# Patient Record
Sex: Male | Born: 1970 | Race: White | Hispanic: No | Smoking: Current every day smoker
Health system: Southern US, Community
[De-identification: ages and names within clinical notes are randomized; demographics above are authoritative.]

## PROBLEM LIST (undated history)

## (undated) DIAGNOSIS — T7840XA Allergy, unspecified, initial encounter: Secondary | ICD-10-CM

## (undated) DIAGNOSIS — K219 Gastro-esophageal reflux disease without esophagitis: Secondary | ICD-10-CM

## (undated) DIAGNOSIS — F329 Major depressive disorder, single episode, unspecified: Secondary | ICD-10-CM

## (undated) DIAGNOSIS — F419 Anxiety disorder, unspecified: Secondary | ICD-10-CM

## (undated) DIAGNOSIS — F32A Depression, unspecified: Secondary | ICD-10-CM

## (undated) DIAGNOSIS — J45909 Unspecified asthma, uncomplicated: Secondary | ICD-10-CM

## (undated) DIAGNOSIS — E785 Hyperlipidemia, unspecified: Secondary | ICD-10-CM

## (undated) HISTORY — PX: FEMUR SURGERY: SHX943

## (undated) HISTORY — DX: Hyperlipidemia, unspecified: E78.5

## (undated) HISTORY — DX: Anxiety disorder, unspecified: F41.9

## (undated) HISTORY — PX: BACK SURGERY: SHX140

## (undated) HISTORY — DX: Allergy, unspecified, initial encounter: T78.40XA

## (undated) HISTORY — PX: WISDOM TOOTH EXTRACTION: SHX21

## (undated) HISTORY — DX: Gastro-esophageal reflux disease without esophagitis: K21.9

---

## 1997-08-17 ENCOUNTER — Inpatient Hospital Stay (HOSPITAL_COMMUNITY): Admission: EM | Admit: 1997-08-17 | Discharge: 1997-08-20 | Payer: Self-pay | Admitting: Emergency Medicine

## 2004-10-30 ENCOUNTER — Ambulatory Visit (HOSPITAL_BASED_OUTPATIENT_CLINIC_OR_DEPARTMENT_OTHER): Admission: RE | Admit: 2004-10-30 | Discharge: 2004-10-30 | Payer: Self-pay | Admitting: Urology

## 2004-10-30 ENCOUNTER — Encounter (INDEPENDENT_AMBULATORY_CARE_PROVIDER_SITE_OTHER): Payer: Self-pay | Admitting: Specialist

## 2004-10-30 ENCOUNTER — Ambulatory Visit (HOSPITAL_COMMUNITY): Admission: RE | Admit: 2004-10-30 | Discharge: 2004-10-30 | Payer: Self-pay | Admitting: Urology

## 2004-12-04 ENCOUNTER — Ambulatory Visit (HOSPITAL_COMMUNITY): Admission: RE | Admit: 2004-12-04 | Discharge: 2004-12-05 | Payer: Self-pay | Admitting: Neurosurgery

## 2008-11-04 ENCOUNTER — Encounter: Admission: RE | Admit: 2008-11-04 | Discharge: 2008-11-04 | Payer: Self-pay | Admitting: Specialist

## 2010-07-10 NOTE — Op Note (Signed)
NAMEMELVIN, WHITEFORD NO.:  [id number]   MEDICAL RECORD NO.:  [id number]          PATIENT TYPE:  OIB   LOCATION:  3007                         FACILITY:  [facility]   PHYSICIAN:  Danae Orleans. Venetia Maxon, M.D.  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   PROCEDURE:  Right L4-5 microdiskectomy with microdissection.   SURGEON:  Danae Orleans. Venetia Maxon, M.D.   ANESTHESIA:  General endotracheal anesthesia.   ESTIMATED BLOOD LOSS:  75 mL.   COMPLICATIONS:  None.   DISPOSITION:  To recovery.   INDICATIONS:  Jonathon Thompson is a 40 year old man with a right L5  radiculopathy.  He has a central disk protrusion at the L5-S1 level but a  free fragment of herniated disk material which appears to be compressing the  right L5 nerve root from an L4-5 disk herniation.  It was elected to take  him to surgery for a microdiskectomy at this affected level.   PROCEDURE:  Mr. Jonathon Thompson was brought to the operating room.  Following the  satisfactory and uncomplicated induction of general endotracheal anesthesia  and placement of intravenous lines, the patient was placed in the prone  position on the Wilson frame.  His low back was then prepped and draped in  the usual sterile fashion and the area of planned incision was infiltrated  with 0.25% Marcaine and 0.5% lidocaine and 1:100,000 epinephrine.  An  incision was made overlying what was felt to be the L4-5 level and was  carried to the lumbar dorsal fascia, which was then incised on the right  side of midline.  Subperiosteal dissection was performed, exposing the L4-5  interspace.  An intraoperative x-ray  was obtained with a marker,  demonstrating this to be the L4-5 level.  Subsequently a hemisemilaminotomy  of L4 was performed with high-speed drill and completed with Kerrison  rongeur, and ligamentous tissue was detached from the superior aspect of the  L5 lamina.  This was then removed in piecemeal fashion with resultant  decompression of the thecal sac, and a foraminotomy was done overlying the  L5 nerve root. The lateral recess was also decompressed.  The microscope was  brought into the field and using microdissection technique, the L5 nerve  root and thecal sac were mobilized medially, exposing multiple free  fragments of herniated disk material, which were directly beneath the  takeoff of the L5 nerve root.  A micropituitary was then used to remove  these free fragments of herniated disk material.  Subsequently after  hemostasis was obtained, the floor of the canal was palpated with a ball  probe but there did not appear to be any residual compression of the nerve  root or thecal sac.  Hemostasis was assured and the wound was irrigated and  2 mL of fentanyl in 80 mg  of Depo-Medrol was placed in the operative field.  The self-retaining retractor was removed.  The lumbar dorsal fascia was  closed with 0 Vicryl suture, subcutaneous tissue was reapproximated with 2-0  Vicryl interrupted, inverted sutures, and the skin edge was reapproximated  with interrupted 3-0 Vicryl subcuticular stitch.  The wound was dressed with  Dermabond.  The patient was extubated in the operating room and taken to the  recovery room in stable and satisfactory condition, having tolerated his  operation well, with counts correct at the end of the case.      Danae Orleans. Venetia Maxon, M.D.  Electronically Signed     JDS/MEDQ  D:  12/04/2004  T:  12/05/2004  Job:  [id number]

## 2010-07-10 NOTE — Op Note (Signed)
Jonathon Thompson, Jonathon Thompson         ACCOUNT NO.:  [id number]   MEDICAL RECORD NO.:  [id number]         PATIENT TYPE:  AMB   LOCATION:  [facility]                         FACILITY:  [facility]   PHYSICIAN:  Lindaann Slough, M.D.  DATE OF BIRTH:  01/18/1971   DATE OF PROCEDURE:  10/30/2004  DATE OF DISCHARGE:                                 OPERATIVE REPORT   PREOPERATIVE DIAGNOSIS:  Elective sterilization.   POSTOPERATIVE DIAGNOSIS:  Elective sterilization.   PROCEDURE DONE:  Bilateral vasectomy.   ANESTHESIA:  General.   INDICATION:  The patient is a 40 year old male who has two children and  wanted to have a vasectomy.  This has been discussed with his wife and they  both agree that their family is complete.  The procedure, the risks, and the  benefits have been discussed with them.  The risks include but are not  limited to hemorrhage, infection, swelling of the scrotum, early or late  recanalization.  They understand and are agreeable.  The patient wanted to  have the procedure under anesthesia.   Under general anesthesia, the patient was prepped and draped and placed in a  supine position.  A 1 cm incision was made on the left scrotum.  The  incision was carried down to the subcutaneous tissues.  The vas was then  secured with a vas clamp.  Then, the vas was separated from the vas sheath  and a segment of the vas was excised.  Each end of the transected vas was  then fulgurated and each end was reversed on itself and doubly ligated with  0 Vicryl.  The distal end of the vas was then covered with the vas sheath.  The proximal end of the vas was then left in the subcutaneous tissues,  completely separated from the distal end.  Hemostasis was secured with  electrocautery.  Then, the incision was closed with 4-0 Vicryl.  The same  procedure was then done on the right side.   The patient tolerated the procedure well and left the OR in satisfactory  condition to the postanesthesia care  unit.      Lindaann Slough, M.D.  Electronically Signed     MN/MEDQ  D:  10/30/2004  T:  10/30/2004  Job:  [id number]   cc:   Dr. Garwin Brothers

## 2011-04-29 ENCOUNTER — Other Ambulatory Visit: Payer: Self-pay | Admitting: Student

## 2011-04-29 ENCOUNTER — Ambulatory Visit
Admission: RE | Admit: 2011-04-29 | Discharge: 2011-04-29 | Disposition: A | Discharge status: Home / Self Care | Payer: No Typology Code available for payment source | Source: Ambulatory Visit | Attending: Student | Admitting: Student

## 2011-04-29 DIAGNOSIS — R7611 Nonspecific reaction to tuberculin skin test without active tuberculosis: Secondary | ICD-10-CM

## 2011-11-07 ENCOUNTER — Encounter (HOSPITAL_COMMUNITY): Payer: Self-pay | Admitting: *Deleted

## 2011-11-07 ENCOUNTER — Emergency Department (INDEPENDENT_AMBULATORY_CARE_PROVIDER_SITE_OTHER)
Admission: EM | Admit: 2011-11-07 | Discharge: 2011-11-07 | Disposition: A | Discharge status: Home / Self Care | Payer: BC Managed Care – PPO | Source: Home / Self Care | Attending: Family Medicine | Admitting: Family Medicine

## 2011-11-07 ENCOUNTER — Emergency Department (INDEPENDENT_AMBULATORY_CARE_PROVIDER_SITE_OTHER): Payer: BC Managed Care – PPO

## 2011-11-07 DIAGNOSIS — R059 Cough, unspecified: Secondary | ICD-10-CM

## 2011-11-07 DIAGNOSIS — R05 Cough: Secondary | ICD-10-CM

## 2011-11-07 DIAGNOSIS — J45901 Unspecified asthma with (acute) exacerbation: Secondary | ICD-10-CM

## 2011-11-07 HISTORY — DX: Unspecified asthma, uncomplicated: J45.909

## 2011-11-07 HISTORY — DX: Depression, unspecified: F32.A

## 2011-11-07 HISTORY — DX: Major depressive disorder, single episode, unspecified: F32.9

## 2011-11-07 MED ORDER — PREDNISONE 20 MG PO TABS
ORAL_TABLET | ORAL | Status: AC
  Filled 2011-11-07: qty 3

## 2011-11-07 MED ORDER — PREDNISONE 20 MG PO TABS: 60.0000 mg | ORAL_TABLET | Freq: Every day | ORAL | Status: DC

## 2011-11-07 MED ORDER — GUAIFENESIN-CODEINE 100-10 MG/5ML PO SYRP: 5.0000 mL | ORAL_SOLUTION | Freq: Three times a day (TID) | ORAL | Status: AC | PRN

## 2011-11-07 MED ORDER — ALBUTEROL SULFATE HFA 108 (90 BASE) MCG/ACT IN AERS: 2.0000 | INHALATION_SPRAY | RESPIRATORY_TRACT | Status: DC | PRN

## 2011-11-07 MED ORDER — ALBUTEROL SULFATE (5 MG/ML) 0.5% IN NEBU
INHALATION_SOLUTION | RESPIRATORY_TRACT | Status: AC
  Filled 2011-11-07: qty 1

## 2011-11-07 MED ORDER — ALBUTEROL SULFATE (5 MG/ML) 0.5% IN NEBU
2.5000 mg | INHALATION_SOLUTION | Freq: Once | RESPIRATORY_TRACT | Status: AC
  Administered 2011-11-07: 2.5 mg via RESPIRATORY_TRACT

## 2011-11-07 MED ORDER — CETIRIZINE HCL 10 MG PO TABS: 10.0000 mg | ORAL_TABLET | Freq: Every day | ORAL | Status: DC

## 2011-11-07 MED ORDER — PREDNISONE 20 MG PO TABS
60.0000 mg | ORAL_TABLET | Freq: Once | ORAL | Status: AC
  Administered 2011-11-07: 60 mg via ORAL

## 2011-11-07 MED ORDER — PREDNISONE 50 MG PO TABS: 50.0000 mg | ORAL_TABLET | Freq: Every day | ORAL | Status: DC

## 2011-11-07 NOTE — ED Notes (Signed)
Breathing treatment completed.  BBS now clear throughout.  States it does feel better breathing now.

## 2011-11-07 NOTE — ED Provider Notes (Signed)
Medical screening examination/treatment/procedure(s) were performed by resident physician or non-physician practitioner and as supervising physician I was immediately available for consultation/collaboration.   KINDL,JAMES DOUGLAS MD.    James D Kindl, MD 11/07/11 1536 

## 2011-11-07 NOTE — ED Provider Notes (Signed)
History     CSN: [id number]  Arrival date & time 11/07/11  1043   First MD Initiated Contact with Patient 11/07/11 1114      Chief Complaint  Patient presents with  . Cough  . Nasal Congestion  . Sore Throat    (Consider location/radiation/quality/duration/timing/severity/associated sxs/prior treatment) The history is provided by the patient.  Jonathon Thompson is a 41 y.o. male who complains of onset of sore throat and cough 3 days ago, states has since progressed to chest congestion.  Has taken tylenol and motrin for symptoms.   Past medical hx includes asthma, no home medications.   + sore throat + cough No pleuritic pain + wheezing at night No nasal congestion No post-nasal drainage No sinus pain/pressure No voice changes + chest congestion No itchy/red eyes No earache No hemoptysis + SOB + chills/sweats No fever No nausea No vomiting No abdominal pain No diarrhea No skin rashes No fatigue No myalgias No headache  No ill contacts   Past Medical History  Diagnosis Date  . Asthma   . Depression     Past Surgical History  Procedure Date  . Back surgery   . Femur surgery   . Wisdom tooth extraction     No family history on file.  History  Substance Use Topics  . Smoking status: Current Every Day Smoker -- 0.5 packs/day    Types: Cigarettes  . Smokeless tobacco: Not on file  . Alcohol Use: No      Review of Systems  All other systems reviewed and are negative.    Allergies  Review of patient's allergies indicates no known allergies.  Home Medications   Current Outpatient Rx  Name Route Sig Dispense Refill  . LEXAPRO PO Oral Take by mouth daily.    Marland Kitchen ONE-DAILY MULTI VITAMINS PO TABS Oral Take 1 tablet by mouth daily.    Marland Kitchen FISH OIL PO Oral Take by mouth.    . ALBUTEROL SULFATE HFA 108 (90 BASE) MCG/ACT IN AERS Inhalation Inhale 2 puffs into the lungs every 4 (four) hours as needed for wheezing. 1 Inhaler 2  . CETIRIZINE HCL 10 MG PO TABS Oral  Take 1 tablet (10 mg total) by mouth daily. 30 tablet 2  . PREDNISONE 50 MG PO TABS Oral Take 1 tablet (50 mg total) by mouth daily. 3 tablet 0    BP 120/78  Pulse 63  Temp 98.6 F (37 C) (Oral)  Resp 16  SpO2 98%  Physical Exam  Nursing note and vitals reviewed. Constitutional: He is oriented to person, place, and time. Vital signs are normal. He appears well-developed and well-nourished. He is active and cooperative.  HENT:  Head: Normocephalic.  Right Ear: Hearing, tympanic membrane, external ear and ear canal normal.  Left Ear: Hearing, tympanic membrane, external ear and ear canal normal.  Nose: Nose normal. Right sinus exhibits no maxillary sinus tenderness and no frontal sinus tenderness. Left sinus exhibits no maxillary sinus tenderness and no frontal sinus tenderness.  Mouth/Throat: Uvula is midline and mucous membranes are normal. Posterior oropharyngeal erythema present.  Eyes: Conjunctivae normal are normal. Pupils are equal, round, and reactive to light. No scleral icterus.  Neck: Trachea normal and normal range of motion. Neck supple. No spinous process tenderness and no muscular tenderness present.  Cardiovascular: Normal rate, regular rhythm, normal heart sounds and normal pulses.   Pulmonary/Chest: Effort normal. He has wheezes. He has rhonchi. He exhibits no tenderness.  Lymphadenopathy:  Head (right side): No submental, no submandibular, no tonsillar, no preauricular, no posterior auricular and no occipital adenopathy present.       Head (left side): No submental, no submandibular, no tonsillar, no preauricular, no posterior auricular and no occipital adenopathy present.    He has no cervical adenopathy.  Neurological: He is alert and oriented to person, place, and time. No cranial nerve deficit or sensory deficit.  Skin: Skin is warm and dry.  Psychiatric: He has a normal mood and affect. His speech is normal and behavior is normal. Judgment and thought content  normal. Cognition and memory are normal.    ED Course  Procedures (including critical care time)  Labs Reviewed - No data to display Dg Chest 2 View  11/07/2011  *RADIOLOGY REPORT*  Clinical Data: Cough.  Chest congestion.  Sore throat.  CHEST - 2 VIEW  Comparison: Two-view chest x-ray 04/29/2011.  Findings: Cardiomediastinal silhouette unremarkable, unchanged. Hyperinflation, unchanged.  Lungs clear.  Bronchovascular markings normal.  Pulmonary vascularity normal.  No pneumothorax.  No pleural effusions.  Visualized bony thorax intact.  No significant interval change.  IMPRESSION: Hyperinflation consistent with asthma and/or COPD.  No acute cardiopulmonary disease.  Stable examination.   Original Report Authenticated By: Arnell Sieving, M.D.      1. Asthma exacerbation   2. Cough       MDM  Take medication as prescribed.  Follow up with a primary care provider in 2 weeks for an asthma action plan for the coming winter months.  Return to office as needed.          Johnsie Kindred, NP 11/07/11 1209

## 2011-11-07 NOTE — ED Notes (Signed)
C/O constant runny nose, chest congestion, slight lingering sore throat x 2 days with progressive worsening.  Last night had difficulty sleeping due to cough and constant runny nose.  Unsure if any fevers.  Has felt he may have some slight wheezing - does not have an inhaler at home.  Coarse ronchi noted in upper lungs - L>R.  Has been taking Tylenol and Motrin prn.

## 2019-11-21 ENCOUNTER — Emergency Department (HOSPITAL_COMMUNITY)
Admission: EM | Admit: 2019-11-21 | Discharge: 2019-11-21 | Disposition: A | Discharge status: Home / Self Care | Payer: BC Managed Care – PPO | Attending: Emergency Medicine | Admitting: Emergency Medicine

## 2019-11-21 ENCOUNTER — Other Ambulatory Visit: Payer: Self-pay

## 2019-11-21 ENCOUNTER — Emergency Department (HOSPITAL_COMMUNITY): Payer: BC Managed Care – PPO

## 2019-11-21 DIAGNOSIS — R0789 Other chest pain: Secondary | ICD-10-CM | POA: Insufficient documentation

## 2019-11-21 DIAGNOSIS — R0602 Shortness of breath: Secondary | ICD-10-CM | POA: Diagnosis not present

## 2019-11-21 DIAGNOSIS — Z20822 Contact with and (suspected) exposure to covid-19: Secondary | ICD-10-CM | POA: Insufficient documentation

## 2019-11-21 DIAGNOSIS — F17210 Nicotine dependence, cigarettes, uncomplicated: Secondary | ICD-10-CM | POA: Diagnosis not present

## 2019-11-21 DIAGNOSIS — J45909 Unspecified asthma, uncomplicated: Secondary | ICD-10-CM | POA: Insufficient documentation

## 2019-11-21 LAB — BASIC METABOLIC PANEL
Anion gap: 13 (ref 5–15)
BUN: 16 mg/dL (ref 6–20)
CO2: 22 mmol/L (ref 22–32)
Calcium: 9.2 mg/dL (ref 8.9–10.3)
Chloride: 105 mmol/L (ref 98–111)
Creatinine, Ser: 0.95 mg/dL (ref 0.61–1.24)
GFR calc Af Amer: >60 mL/min (ref >60)
GFR calc non Af Amer: >60 mL/min (ref >60)
Glucose, Bld: 115 mg/dL — ABNORMAL HIGH (ref 70–99)
Potassium: 3.8 mmol/L (ref 3.5–5.1)
Sodium: 140 mmol/L (ref 135–145)

## 2019-11-21 LAB — HEPATIC FUNCTION PANEL
ALT: 76 U/L — ABNORMAL HIGH (ref 0–44)
AST: 56 U/L — ABNORMAL HIGH (ref 15–41)
Albumin: 4.3 g/dL (ref 3.5–5.0)
Alkaline Phosphatase: 78 U/L (ref 38–126)
Bilirubin, Direct: <0.1 mg/dL (ref 0.0–0.2)
Total Bilirubin: 0.5 mg/dL (ref 0.3–1.2)
Total Protein: 7.2 g/dL (ref 6.5–8.1)

## 2019-11-21 LAB — CBC WITH DIFFERENTIAL/PLATELET
Abs Immature Granulocytes: 0.05 10*3/uL (ref 0.00–0.07)
Basophils Absolute: 0.1 10*3/uL (ref 0.0–0.1)
Basophils Relative: 1 %
Eosinophils Absolute: 0.2 10*3/uL (ref 0.0–0.5)
Eosinophils Relative: 2 %
HCT: 43 % (ref 39.0–52.0)
Hemoglobin: 14.7 g/dL (ref 13.0–17.0)
Immature Granulocytes: 0 %
Lymphocytes Relative: 24 %
Lymphs Abs: 2.8 10*3/uL (ref 0.7–4.0)
MCH: 32 pg (ref 26.0–34.0)
MCHC: 34.2 g/dL (ref 30.0–36.0)
MCV: 93.5 fL (ref 80.0–100.0)
Monocytes Absolute: 0.8 10*3/uL (ref 0.1–1.0)
Monocytes Relative: 7 %
Neutro Abs: 7.7 10*3/uL (ref 1.7–7.7)
Neutrophils Relative %: 66 %
Platelets: 290 10*3/uL (ref 150–400)
RBC: 4.6 MIL/uL (ref 4.22–5.81)
RDW: 13.6 % (ref 11.5–15.5)
WBC: 11.7 10*3/uL — ABNORMAL HIGH (ref 4.0–10.5)
nRBC: 0 % (ref 0.0–0.2)

## 2019-11-21 LAB — RESPIRATORY PANEL BY RT PCR (FLU A&B, COVID)
Influenza A by PCR: NEGATIVE
Influenza B by PCR: NEGATIVE
SARS Coronavirus 2 by RT PCR: NEGATIVE

## 2019-11-21 LAB — LIPASE, BLOOD: Lipase: 43 U/L (ref 11–51)

## 2019-11-21 LAB — TROPONIN I (HIGH SENSITIVITY)
Troponin I (High Sensitivity): 3 ng/L (ref <18)
Troponin I (High Sensitivity): 2 ng/L (ref <18)

## 2019-11-21 MED ORDER — PANTOPRAZOLE SODIUM 20 MG PO TBEC: 20.0000 mg | DELAYED_RELEASE_TABLET | Freq: Every day | ORAL | 0 refills | Status: AC

## 2019-11-21 NOTE — ED Triage Notes (Signed)
Pt came in via POV with c/o SOB today. Pt had an episode yesterday at approx 1000 a.m. at work where he got a knot like chest pain, left arm pain, diaphoresis and jaw pain. States the episode lasted 5-6 min. Pt states "I think I had a heart attack yesterday". Pt has had some nausea and diarrhea that started last night. Pt states that it is hard for him to catch a good breath. He states that he has not been around anyone with Covid

## 2019-11-21 NOTE — ED Provider Notes (Signed)
[facility]-EMERGENCY DEPT Provider Note   CSN: [id number] Arrival date & time: 11/21/19  9323     History Chief Complaint  Patient presents with  . Shortness of Breath  . Chest Pain    Granville Whitefield is a 49 y.o. male.  The history is provided by the patient.  Chest Pain Pain location:  Substernal area Pain quality: burning   Pain radiates to:  Does not radiate Pain severity:  Mild Onset quality:  Gradual Timing:  Intermittent Progression:  Waxing and waning Chronicity:  New Context: eating   Relieved by:  Nothing Worsened by:  Nothing Associated symptoms: no abdominal pain, no back pain, no cough, no fever, no palpitations, no shortness of breath and no vomiting   Risk factors: high cholesterol and smoking   Risk factors: no coronary artery disease and no diabetes mellitus        Past Medical History:  Diagnosis Date  . Asthma   . Depression     There are no problems to display for this patient.   Past Surgical History:  Procedure Laterality Date  . BACK SURGERY    . FEMUR SURGERY    . WISDOM TOOTH EXTRACTION         No family history on file.  Social History   Tobacco Use  . Smoking status: Current Every Day Smoker    Packs/day: 0.50    Types: Cigarettes  Substance Use Topics  . Alcohol use: No  . Drug use: Not on file    Home Medications Prior to Admission medications   Medication Sig Start Date End Date Taking? Authorizing Provider  atorvastatin (LIPITOR) 40 MG tablet Take 40 mg by mouth daily. 11/14/19  Yes [provider]  buPROPion (WELLBUTRIN XL) 300 MG 24 hr tablet Take 300 mg by mouth daily. 11/14/19  Yes [provider]  escitalopram (LEXAPRO) 20 MG tablet Take 20 mg by mouth daily. 09/25/19  Yes [provider]  ibuprofen (ADVIL) 200 MG tablet Take 200-600 mg by mouth every 6 (six) hours as needed for fever or moderate pain.   Yes [provider]  pantoprazole  (PROTONIX) 20 MG tablet Take 1 tablet (20 mg total) by mouth daily. 11/21/19 12/21/19  Virgina Norfolk, DO    Allergies    Patient has no known allergies.  Review of Systems   Review of Systems  Constitutional: Negative for chills and fever.  HENT: Negative for ear pain and sore throat.   Eyes: Negative for pain and visual disturbance.  Respiratory: Negative for cough and shortness of breath.   Cardiovascular: Positive for chest pain. Negative for palpitations.  Gastrointestinal: Negative for abdominal pain and vomiting.  Genitourinary: Negative for dysuria and hematuria.  Musculoskeletal: Negative for arthralgias and back pain.  Skin: Negative for color change and rash.  Neurological: Negative for seizures and syncope.  All other systems reviewed and are negative.   Physical Exam Updated Vital Signs  ED Triage Vitals  Enc Vitals Group     BP 11/21/19 0700 (!) 146/88     Pulse Rate 11/21/19 0700 79     Resp 11/21/19 0700 14     Temp 11/21/19 0700 98.4 F (36.9 C)     Temp Source 11/21/19 0700 Oral     SpO2 11/21/19 0700 98 %     Weight --      Height --      Head Circumference --      Peak Flow --  Pain Score 11/21/19 0701 0     Pain Loc --      Pain Edu? --      Excl. in GC? --     Physical Exam Vitals and nursing note reviewed.  Constitutional:      General: He is not in acute distress.    Appearance: He is well-developed. He is not ill-appearing.  HENT:     Head: Normocephalic and atraumatic.  Eyes:     Conjunctiva/sclera: Conjunctivae normal.     Pupils: Pupils are equal, round, and reactive to light.  Cardiovascular:     Rate and Rhythm: Normal rate and regular rhythm.     Pulses: Normal pulses.     Heart sounds: Normal heart sounds. No murmur heard.   Pulmonary:     Effort: Pulmonary effort is normal. No respiratory distress.     Breath sounds: Normal breath sounds. No decreased breath sounds, wheezing or rhonchi.  Abdominal:     Palpations:  Abdomen is soft.     Tenderness: There is no abdominal tenderness.  Musculoskeletal:        General: Normal range of motion.     Cervical back: Normal range of motion and neck supple.     Right lower leg: No edema.     Left lower leg: No edema.  Skin:    General: Skin is warm and dry.     Capillary Refill: Capillary refill takes less than 2 seconds.  Neurological:     General: No focal deficit present.     Mental Status: He is alert.     ED Results / Procedures / Treatments   Labs (all labs ordered are listed, but only abnormal results are displayed) Labs Reviewed  CBC WITH DIFFERENTIAL/PLATELET - Abnormal; Notable for the following components:      Result Value   WBC 11.7 (*)    All other components within normal limits  BASIC METABOLIC PANEL - Abnormal; Notable for the following components:   Glucose, Bld 115 (*)    All other components within normal limits  HEPATIC FUNCTION PANEL - Abnormal; Notable for the following components:   AST 56 (*)    ALT 76 (*)    All other components within normal limits  RESPIRATORY PANEL BY RT PCR (FLU A&B, COVID)  LIPASE, BLOOD  TROPONIN I (HIGH SENSITIVITY)  TROPONIN I (HIGH SENSITIVITY)    EKG  Is read and interpreted by me, EKG shows normal sinus rhythm.  No ischemic changes.  Normal intervals.  Normal rate and rhythm.  Radiology DG Chest Portable 1 View  Result Date: 11/21/2019 CLINICAL DATA:  Chest pain and jaw pain EXAM: PORTABLE CHEST 1 VIEW COMPARISON:  11/07/2011 FINDINGS: Normal heart size and mediastinal contours. No acute infiltrate or edema. No effusion or pneumothorax. No acute osseous findings. IMPRESSION: Negative chest. Electronically Signed   By: Marnee Spring M.D.   On: 11/21/2019 07:49    Procedures Procedures (including critical care time)  Medications Ordered in ED Medications - No data to display  ED Course  I have reviewed the triage vital signs and the nursing notes.  Pertinent labs & imaging results  that were available during my care of the patient were reviewed by me and considered in my medical decision making (see chart for details).    MDM Rules/Calculators/A&P                          Laddie Aquas is  a 49 year old male with history of high cholesterol, smoking who presents the ED with chest pain.  Patient with unremarkable vitals.  No fever.  Intermittent chest pain associated with eating for the last several days.  No infectious symptoms.  No current chest pain now.  No abdominal pain.  Overall asymptomatic.  Exam is overall unremarkable.  Heart score is 2.  Patient is PERC negative and doubt PE.  EKG shows sinus rhythm with no ischemic changes.  Will check troponins, basic labs, chest x-ray.  Suspect MSK pain, anxiety, reflux, less likely ACS.  No concern for dissection.  No significant anemia, electrolyte abnormality, kidney injury. Chest x-ray without signs of pneumonia, no pneumothorax, no pleural effusion. Troponin negative x2. Overall suspect GI related pain. Discharged in good condition. Started Protonix. Recommend follow-up with primary care doctor.  This chart was dictated using voice recognition software.  Despite best efforts to proofread,  errors can occur which can change the documentation meaning.    Final Clinical Impression(s) / ED Diagnoses Final diagnoses:  Atypical chest pain    Rx / DC Orders ED Discharge Orders         Ordered    pantoprazole (PROTONIX) 20 MG tablet  Daily        11/21/19 1036           Merlie Noga, Madelaine Bhat, DO 11/21/19 1037

## 2019-11-21 NOTE — Discharge Instructions (Addendum)
Work-up today is overall unremarkable. Your heart labs were normal. EKG is normal. Suspect pain likely from stomach related pain. Please take medication as prescribed and follow-up with your primary care doctor.

## 2021-07-04 IMAGING — DX DG CHEST 1V PORT
2 series · 2 of 2 positions shown · non-contrast
Comparison: 11/07/2011

CLINICAL DATA: Chest pain and jaw pain

EXAM:
PORTABLE CHEST 1 VIEW

[chest ap (1 of 2)]
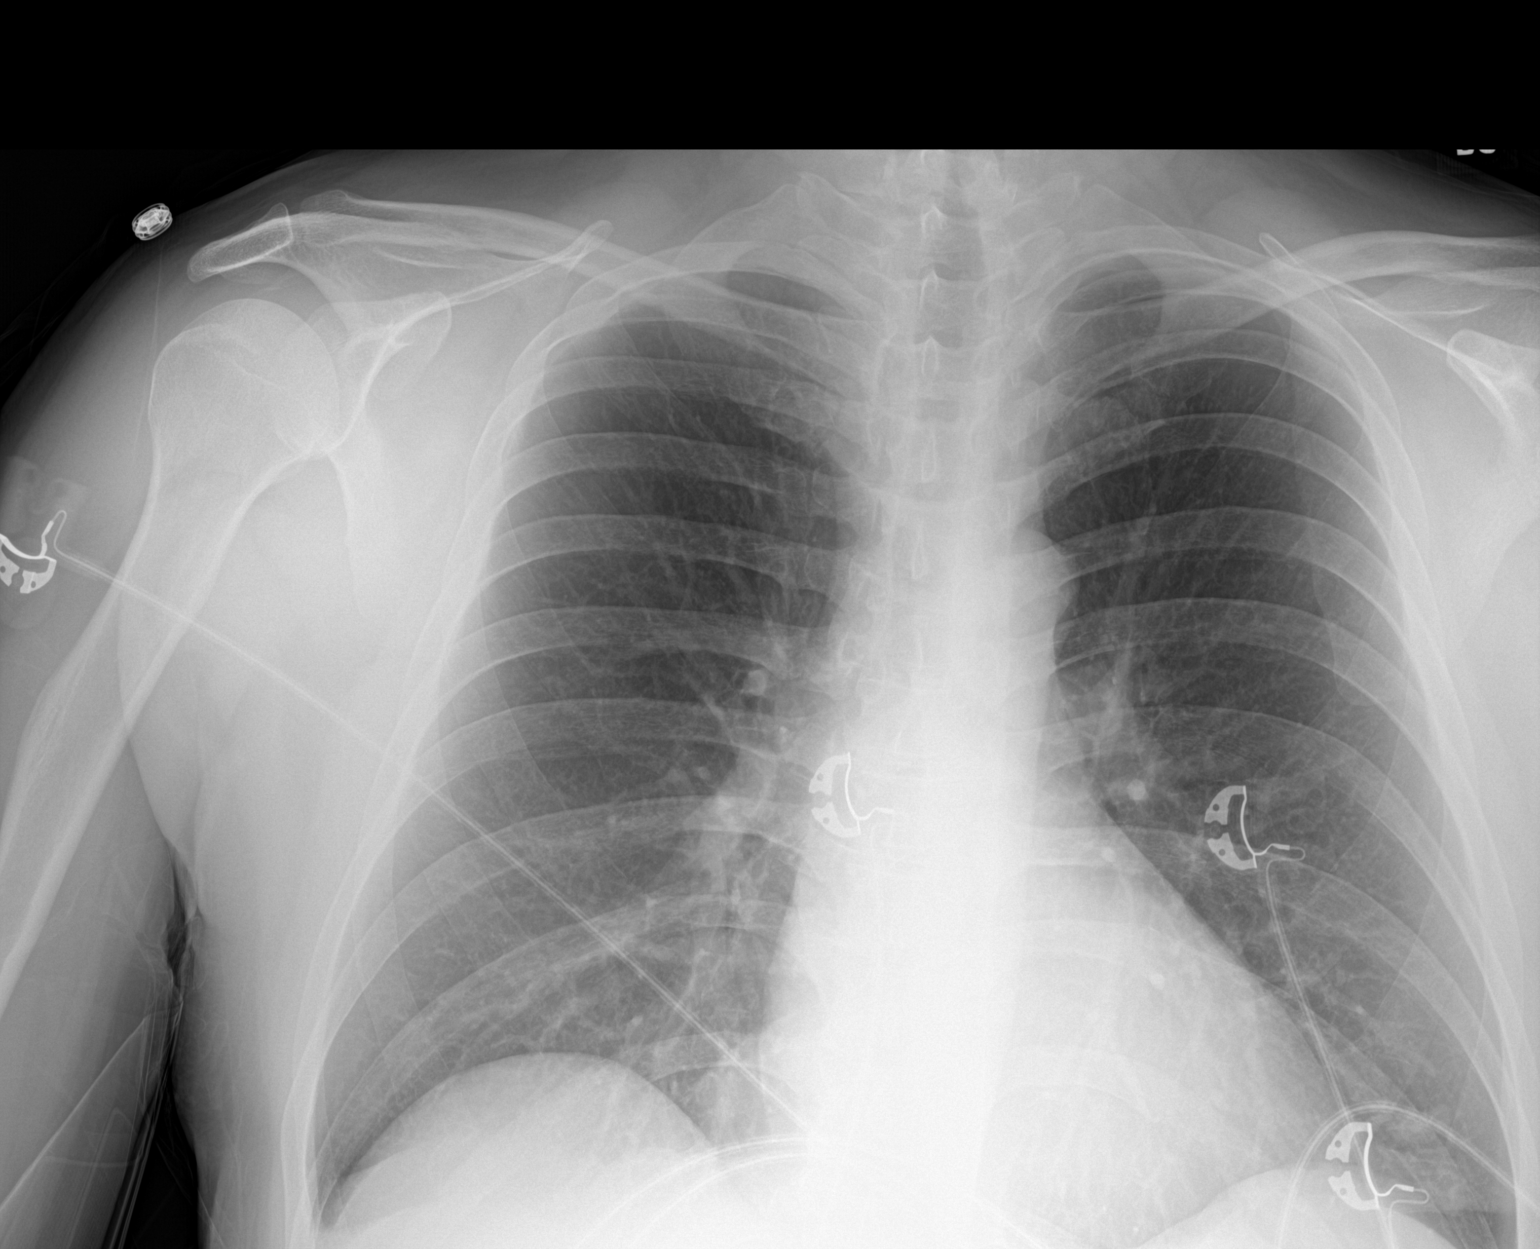

[chest ap (2 of 2)]
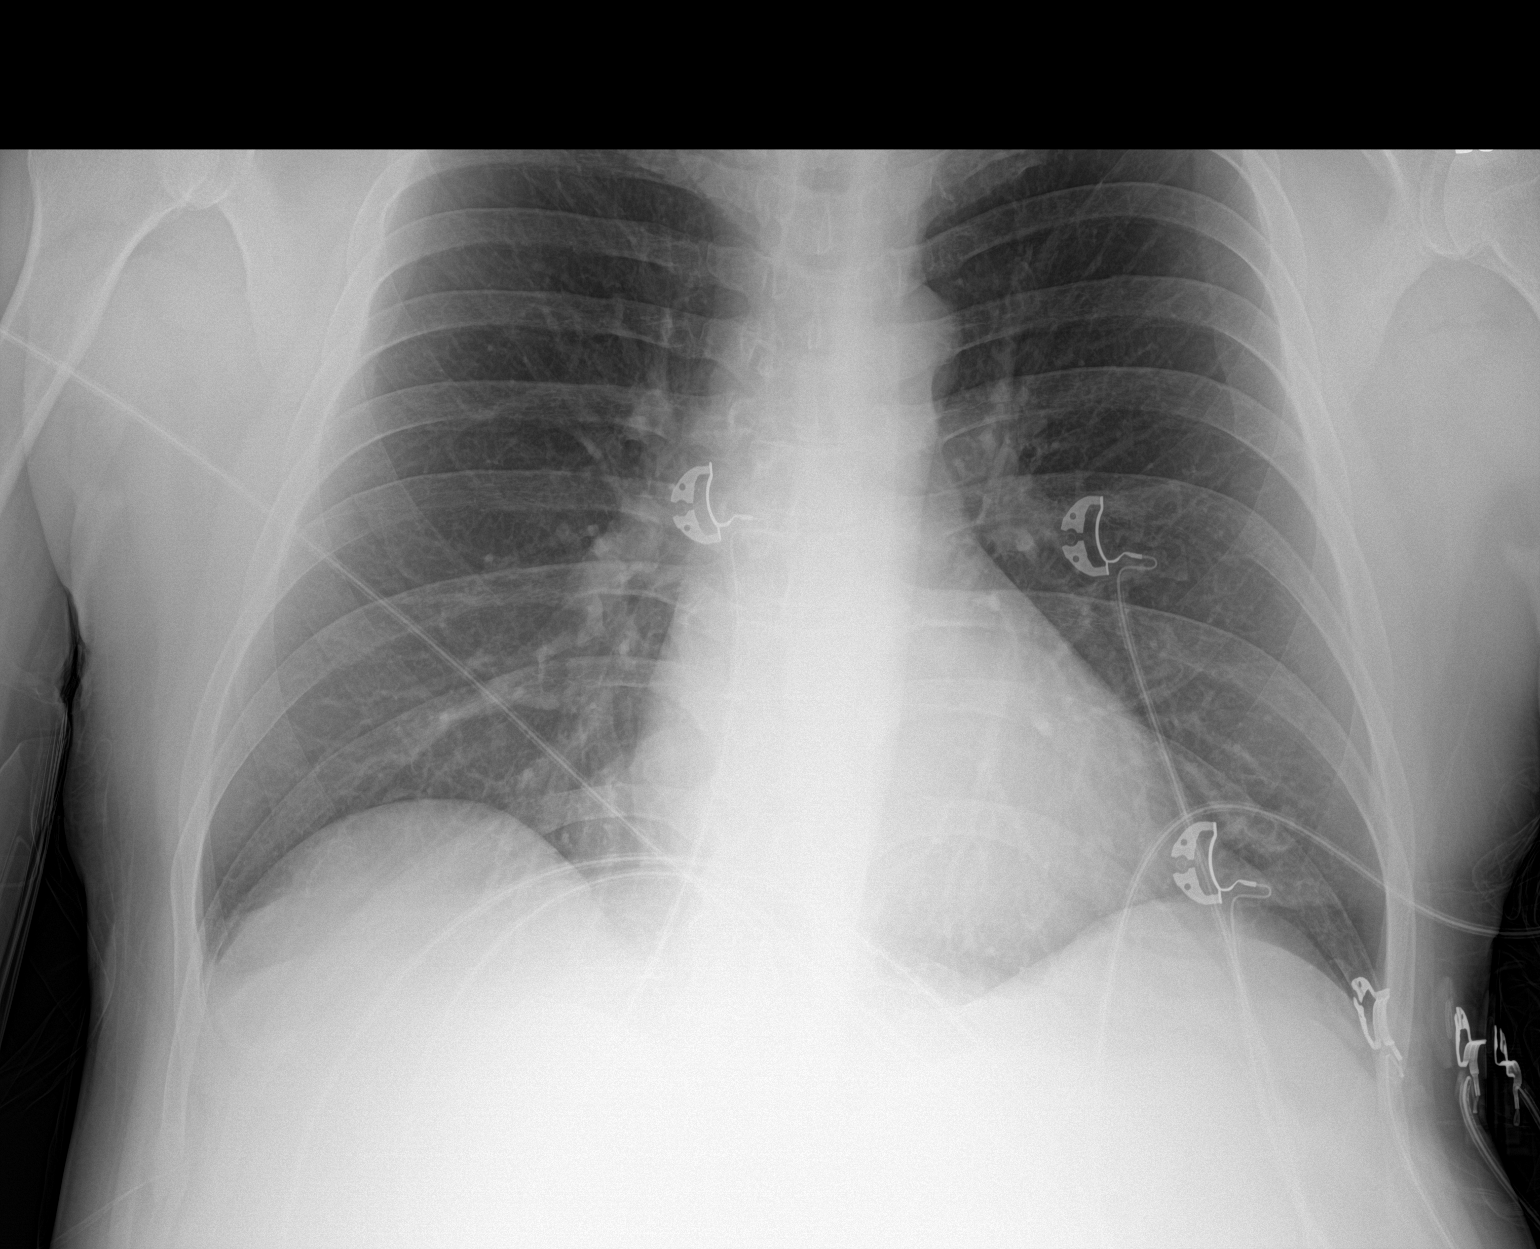

[2 of 2 positions shown; findings below may reference images not displayed]

FINDINGS: Normal heart size and mediastinal contours. No acute infiltrate or
edema. No effusion or pneumothorax. No acute osseous findings.
IMPRESSION: Negative chest.

## 2023-09-06 ENCOUNTER — Encounter: Payer: Self-pay | Admitting: Gastroenterology

## 2023-09-15 ENCOUNTER — Ambulatory Visit (AMBULATORY_SURGERY_CENTER): Payer: MEDICAID

## 2023-09-15 VITALS — Ht 65.0 in | Wt 180.0 lb

## 2023-09-15 DIAGNOSIS — Z1211 Encounter for screening for malignant neoplasm of colon: Secondary | ICD-10-CM

## 2023-09-15 MED ORDER — NA SULFATE-K SULFATE-MG SULF 17.5-3.13-1.6 GM/177ML PO SOLN: 1.0000 | Freq: Once | ORAL | 0 refills | Status: AC

## 2023-09-15 NOTE — Progress Notes (Signed)

## 2023-09-29 ENCOUNTER — Encounter: Payer: Self-pay | Admitting: Gastroenterology

## 2023-09-29 ENCOUNTER — Ambulatory Visit: Payer: MEDICAID | Admitting: Gastroenterology

## 2023-09-29 VITALS — BP 134/65 | HR 75 | Temp 98.0°F | Resp 19 | Ht 65.0 in | Wt 180.0 lb

## 2023-09-29 DIAGNOSIS — D123 Benign neoplasm of transverse colon: Secondary | ICD-10-CM

## 2023-09-29 DIAGNOSIS — K648 Other hemorrhoids: Secondary | ICD-10-CM

## 2023-09-29 DIAGNOSIS — Z1211 Encounter for screening for malignant neoplasm of colon: Secondary | ICD-10-CM | POA: Diagnosis not present

## 2023-09-29 DIAGNOSIS — D120 Benign neoplasm of cecum: Secondary | ICD-10-CM

## 2023-09-29 DIAGNOSIS — D175 Benign lipomatous neoplasm of intra-abdominal organs: Secondary | ICD-10-CM | POA: Diagnosis not present

## 2023-09-29 DIAGNOSIS — K6389 Other specified diseases of intestine: Secondary | ICD-10-CM | POA: Diagnosis not present

## 2023-09-29 MED ORDER — SODIUM CHLORIDE 0.9 % IV SOLN: 500.0000 mL | Freq: Once | INTRAVENOUS | Status: AC

## 2023-09-29 NOTE — Patient Instructions (Addendum)
 Resume previous diet  Continue present medications Await pathology results  Repeat colonoscopy in 3 years for surveillance. Golytely prep for next exam. See handout on polyps  YOU HAD AN ENDOSCOPIC PROCEDURE TODAY AT THE [facility]:   Refer to the procedure report that was given to you for any specific questions about what was found during the examination.  If the procedure report does not answer your questions, please call your gastroenterologist to clarify.  If you requested that your care partner not be given the details of your procedure findings, then the procedure report has been included in a sealed envelope for you to review at your convenience later.  YOU SHOULD EXPECT: Some feelings of bloating in the abdomen. Passage of more gas than usual.  Walking can help get rid of the air that was put into your GI tract during the procedure and reduce the bloating. If you had a lower endoscopy (such as a colonoscopy or flexible sigmoidoscopy) you may notice spotting of blood in your stool or on the toilet paper. If you underwent a bowel prep for your procedure, you may not have a normal bowel movement for a few days.  Please Note:  You might notice some irritation and congestion in your nose or some drainage.  This is from the oxygen used during your procedure.  There is no need for concern and it should clear up in a day or so.  SYMPTOMS TO REPORT IMMEDIATELY: Following lower endoscopy (colonoscopy or flexible sigmoidoscopy):  Excessive amounts of blood in the stool  Significant tenderness or worsening of abdominal pains  Swelling of the abdomen that is new, acute  Fever of 100F or higher  For urgent or emergent issues, a gastroenterologist can be reached at any hour by calling [phone]. Do not use MyChart messaging for urgent concerns.   DIET:  We do recommend a small meal at first, but then you may proceed to your regular diet.  Drink plenty of fluids but you should avoid  alcoholic beverages for 24 hours.  ACTIVITY:  You should plan to take it easy for the rest of today and you should NOT DRIVE or use heavy machinery until tomorrow (because of the sedation medicines used during the test).    FOLLOW UP: Our staff will call the number listed on your records the next business day following your procedure.  We will call around 7:15- 8:00 am to check on you and address any questions or concerns that you may have regarding the information given to you following your procedure. If we do not reach you, we will leave a message.     If any biopsies were taken you will be contacted by phone or by letter within the next 1-3 weeks.  Please call us  at [phone] if you have not heard about the biopsies in 3 weeks.   SIGNATURES/CONFIDENTIALITY: You and/or your care partner have signed paperwork which will be entered into your electronic medical record.  These signatures attest to the fact that that the information above on your After Visit Summary has been reviewed and is understood.  Full responsibility of the confidentiality of this discharge information lies with you and/or your care-partner.

## 2023-09-29 NOTE — Op Note (Signed)
  [facility] Patient Name: Pratt Bress Procedure Date: 09/29/2023 4:08 PM MRN: [id number] Endoscopist: Victory L. Legrand , MD, [id number] Age: 53 Referring MD:  Date of Birth: [birth year] Gender: Male Account #: [id number] Procedure:                Colonoscopy Indications:              Screening for colorectal malignant neoplasm, This                            is the patient's first colonoscopy Medicines:                Monitored Anesthesia Care Procedure:                Pre-Anesthesia Assessment:                           - Prior to the procedure, a History and Physical                            was performed, and patient medications and                            allergies were reviewed. The patient's tolerance of                            previous anesthesia was also reviewed. The risks                            and benefits of the procedure and the sedation                            options and risks were discussed with the patient.                            All questions were answered, and informed consent                            was obtained. Prior Anticoagulants: The patient has                            taken no anticoagulant or antiplatelet agents. ASA                            Grade Assessment: II - A patient with mild systemic                            disease. After reviewing the risks and benefits,                            the patient was deemed in satisfactory condition to                            undergo the procedure.  After obtaining informed consent, the colonoscope                            was passed under direct vision. Throughout the                            procedure, the patient's blood pressure, pulse, and                            oxygen saturations were monitored continuously. The                            Olympus CF-HQ190L ([id number]) Colonoscope was                            introduced through the  anus and advanced to the the                            cecum, identified by appendiceal orifice and                            ileocecal valve. The colonoscopy was performed                            without difficulty. The patient tolerated the                            procedure well. The quality of the bowel                            preparation was fair in some areas despite                            extensive lavage of adherent material. The                            ileocecal valve, appendiceal orifice, and rectum                            were photographed. The bowel preparation used was                            SUPREP. Scope In: 4:16:23 PM Scope Out: 4:39:45 PM Scope Withdrawal Time: 0 hours 19 minutes 21 seconds  Total Procedure Duration: 0 hours 23 minutes 22 seconds  Findings:                 The perianal and digital rectal examinations were                            normal.                           Repeat examination of right colon under NBI  performed.                           A 6 mm polyp was found in the cecum. The polyp was                            sessile. The polyp was removed with a cold snare.                            Resection and retrieval were complete. (Jar 1)                           A 6 mm polyp was found in the transverse colon. The                            polyp was semi-sessile. The polyp was removed with                            a cold snare. Resection and retrieval were                            complete. (Jar 2)                           Internal hemorrhoids were found. The hemorrhoids                            were medium-sized.                           The exam was otherwise without abnormality on                            direct and retroflexion views. Complications:            No immediate complications. Estimated Blood Loss:     Estimated blood loss was minimal. Impression:               -  Preparation of the colon was fair.                           - One 6 mm polyp in the cecum, removed with a cold                            snare. Resected and retrieved.                           - One 6 mm polyp in the transverse colon, removed                            with a cold snare. Resected and retrieved.                           - Internal hemorrhoids.                           -  The examination was otherwise normal on direct                            and retroflexion views. Recommendation:           - Patient has a contact number available for                            emergencies. The signs and symptoms of potential                            delayed complications were discussed with the                            patient. Return to normal activities tomorrow.                            Written discharge instructions were provided to the                            patient.                           - Resume previous diet.                           - Continue present medications.                           - Await pathology results.                           - Repeat colonoscopy in 3 years for surveillance                            (see prep details). Golytely prep for next exam Tonianne Fine L. Legrand, MD 09/29/2023 4:46:35 PM This report has been signed electronically.

## 2023-09-29 NOTE — Progress Notes (Unsigned)
 Vss nad trans to pacu

## 2023-09-29 NOTE — Progress Notes (Unsigned)
 Pt's states no medical or surgical changes since previsit or office visit.

## 2023-09-29 NOTE — Progress Notes (Unsigned)
 History and Physical:  This patient presents for endoscopic testing for: Encounter Diagnosis  Name Primary?   Special screening for malignant neoplasms, colon Yes    Average risk for colorectal cancer.  1st screening exam.  Patient denies chronic abdominal pain, rectal bleeding, constipation or diarrhea.   Patient is otherwise without complaints or active issues today.   Past Medical History: Past Medical History:  Diagnosis Date   Allergy    Anxiety    Asthma    Depression    GERD (gastroesophageal reflux disease)    Hyperlipidemia      Past Surgical History: Past Surgical History:  Procedure Laterality Date   BACK SURGERY     FEMUR SURGERY     WISDOM TOOTH EXTRACTION      Allergies: No Known Allergies  Outpatient Meds: Current Outpatient Medications  Medication Sig Dispense Refill   acetaminophen (TYLENOL) 325 MG tablet Take 650 mg by mouth every 6 (six) hours as needed.     atorvastatin (LIPITOR) 40 MG tablet Take 40 mg by mouth daily.     baclofen (LIORESAL) 10 MG tablet Take 10 mg by mouth 2 (two) times daily as needed.     busPIRone (BUSPAR) 5 MG tablet Take 5 mg by mouth 2 (two) times daily.     doxepin (SINEQUAN) 100 MG capsule Take 100 mg by mouth at bedtime.     gabapentin (NEURONTIN) 600 MG tablet Take 600 mg by mouth 2 (two) times daily.     OLANZapine (ZYPREXA) 5 MG tablet Take 5 mg by mouth daily.     omega-3 acid ethyl esters (LOVAZA) 1 g capsule Take 2 capsules by mouth 2 (two) times daily.     sertraline (ZOLOFT) 50 MG tablet Take 50 mg by mouth daily.     traZODone (DESYREL) 150 MG tablet Take 150 mg by mouth at bedtime as needed.     ibuprofen (ADVIL) 200 MG tablet Take 200-600 mg by mouth every 6 (six) hours as needed for fever or moderate pain.     pantoprazole  (PROTONIX ) 20 MG tablet Take 1 tablet (20 mg total) by mouth daily. (Patient not taking: No sig reported) 30 tablet 0   Current Facility-Administered Medications  Medication Dose Route  Frequency Provider Last Rate Last Admin   0.9 %  sodium chloride  infusion  500 mL Intravenous Once Danis, Victory CROME III, MD          ___________________________________________________________________ Objective   Exam:  BP 138/82   Pulse 74   Temp 98 F (36.7 C) (Temporal)   Ht 5' 5 (1.651 m)   Wt 180 lb (81.6 kg)   SpO2 98%   BMI 29.95 kg/m   CV: regular , S1/S2 Resp: clear to auscultation bilaterally, normal RR and effort noted GI: soft, no tenderness, with active bowel sounds.   Assessment: Encounter Diagnosis  Name Primary?   Special screening for malignant neoplasms, colon Yes     Plan: Colonoscopy   The benefits and risks of the planned procedure(s) were described in detail with the patient or (when appropriate) their health care proxy.  Risks were outlined as including, but not limited to, bleeding, infection, perforation, adverse medication reaction leading to cardiac or pulmonary decompensation, pancreatitis (if ERCP).  The limitation of incomplete mucosal visualization was also discussed.  No guarantees or warranties were given.  The patient is appropriate for an endoscopic procedure in the ambulatory setting.   - Victory Brand, MD

## 2023-09-29 NOTE — Progress Notes (Unsigned)
 Called to room to assist during endoscopic procedure.  Patient ID and intended procedure confirmed with present staff. Received instructions for my participation in the procedure from the performing physician.

## 2023-09-30 ENCOUNTER — Telehealth: Payer: Self-pay

## 2023-09-30 NOTE — Telephone Encounter (Signed)
 Left message on answering machine.

## 2023-10-04 ENCOUNTER — Ambulatory Visit: Payer: Self-pay | Admitting: Gastroenterology

## 2023-10-04 LAB — SURGICAL PATHOLOGY
